# Patient Record
Sex: Male | Born: 1995 | Race: Black or African American | Hispanic: No | Marital: Single | Smoking: Never smoker
Health system: Southern US, Community
[De-identification: ages and names within clinical notes are randomized; demographics above are authoritative.]

---

## 2015-09-08 ENCOUNTER — Emergency Department (HOSPITAL_COMMUNITY)
Admission: EM | Admit: 2015-09-08 | Discharge: 2015-09-09 | Disposition: A | Discharge status: Home / Self Care | Payer: Self-pay | Attending: Emergency Medicine | Admitting: Emergency Medicine

## 2015-09-08 ENCOUNTER — Emergency Department (HOSPITAL_COMMUNITY): Payer: Self-pay

## 2015-09-08 ENCOUNTER — Encounter (HOSPITAL_COMMUNITY): Payer: Self-pay | Admitting: Emergency Medicine

## 2015-09-08 DIAGNOSIS — Y9389 Activity, other specified: Secondary | ICD-10-CM | POA: Insufficient documentation

## 2015-09-08 DIAGNOSIS — W002XXA Other fall from one level to another due to ice and snow, initial encounter: Secondary | ICD-10-CM | POA: Insufficient documentation

## 2015-09-08 DIAGNOSIS — Y9289 Other specified places as the place of occurrence of the external cause: Secondary | ICD-10-CM | POA: Insufficient documentation

## 2015-09-08 DIAGNOSIS — S83004A Unspecified dislocation of right patella, initial encounter: Secondary | ICD-10-CM | POA: Insufficient documentation

## 2015-09-08 DIAGNOSIS — M25461 Effusion, right knee: Secondary | ICD-10-CM | POA: Insufficient documentation

## 2015-09-08 DIAGNOSIS — Y998 Other external cause status: Secondary | ICD-10-CM | POA: Insufficient documentation

## 2015-09-08 NOTE — ED Notes (Signed)
Patient reports falling in snow and being kicked in right knee. C/o pain and swelling. Mild swelling to same. Denies numbness or tingling. Ambulatory with steady gait.

## 2015-09-08 NOTE — Discharge Instructions (Signed)
Keep the knee immobilizer on at all times including when sleeping except when bathing. Do not stop using the knee immobilizer until you are cleared by your orthopedist.  Do not hesitate to return to the emergency room for any new, worsening or concerning symptoms.  Please obtain primary care using resource guide below. Let them know that you were seen in the emergency room and that they will need to obtain records for further outpatient management.   Patellar Dislocation A patellar dislocation occurs when your kneecap (patella) slips out of its normal position in a groove in front of the lower end of your thighbone (femur). This groove is called the patellofemoral groove.  CAUSES The kneecap is normally positioned over the front of the knee joint at the base of the thighbone. A kneecap can be dislocated when:  The kneecap is out of place (patellar tracking disorder), and force is applied.  The foot is firmly planted pointing outward, and the knee bends with the thigh turned inward. This kind of injury is common during many sports activities.  The inner edge of the kneecap is hit, pushing it toward the outer side of the leg. SIGNS AND SYMPTOMS  Severe pain.  A misshapen knee that looks like a bone is out of position.  A popping sensation, followed by a feeling that something is out of place.  Inability to bend or straighten the knee.  Knee swelling.  Cool, pale skin or numbness and tingling in or below the affected knee. DIAGNOSIS  Your health care provider will physically examine the injured area. An X-ray exam may be done to make sure a bone fracture has not occurred. In some cases, your health care provider may look inside your knee joint with an instrument much like a pencil-sized telescope (arthroscope). This may be done to make sure you have no loose cartilage in your joint. Loose cartilage is not visible on an X-ray image. TREATMENT  In many instances, the patella can be guided  back into position without much difficulty. It often goes back into position by straightening the leg. Often, nothing more may be needed other than a brief period of immobilization followed by the exercises your health care provider recommends. If patellar dislocation starts to become frequent after the first incident, surgery may be needed to prevent your patella from slipping out of place. HOME CARE INSTRUCTIONS   Only take over-the-counter or prescription medicines for pain, discomfort, or fever as directed by your health care provider.  Use a knee brace if directed to do so by your health care provider.  Use crutches as instructed.  Apply ice to the injured knee:  Put ice in a plastic bag.  Place a towel between your skin and the bag.  Leave the ice on for 20 minutes, 2-3 times a day.  Follow your health care provider's instructions for doing any recommended range-of-motion exercises or other exercises. SEEK IMMEDIATE MEDICAL CARE IF:  You have increased pain or swelling in the knee that is not relieved with medicine.  You have increasing inflammation in the knee.  You have locking or catching of your knee. MAKE SURE YOU:  Understand these instructions.  Will watch your condition.  Will get help right away if you are not doing well or get worse.   This information is not intended to replace advice given to you by your health care provider. Make sure you discuss any questions you have with your health care provider.   Document Released: 05/07/2001  Document Revised: 06/02/2013 Document Reviewed: 03/24/2013 Elsevier Interactive Patient Education 2016 [employer]  How to Use a Knee Brace A knee brace is a device that you wear to support your knee, especially if the knee is healing after an injury or surgery. There are several types of knee braces. Some are designed to prevent an injury (prophylactic brace). These are often worn during sports. Others support an injured knee  (functional brace) or keep it still while it heals (rehabilitative brace). People with severe arthritis of the knee may benefit from a brace that takes some pressure off the knee (unloader brace). Most knee braces are made from a combination of cloth and metal or plastic.  You may need to wear a knee brace to:  Relieve knee pain.  Help your knee support your weight (improve stability).  Help you walk farther (improve mobility).  Prevent injury.  Support your knee while it heals from surgery or from an injury. RISKS AND COMPLICATIONS Generally, knee braces are very safe to wear. However, problems may occur, including:  Skin irritation that may lead to infection.  Making your condition worse if you wear the brace in the wrong way. HOW TO USE A KNEE BRACE Different braces will have different instructions for use. Your health care provider will tell you or show you:  How to put on your brace.  How to adjust the brace.  When and how often to wear the brace.  How to remove the brace.  If you will need any assistive devices in addition to the brace, such as crutches or a cane. In general, your brace should:  Have the hinge of the brace line up with the bend of your knee.  Have straps, hooks, or tapes that fasten snugly around your leg.  Not feel too tight or too loose. HOW TO CARE FOR A KNEE BRACE  Check your brace often for signs of damage, such as loose connections or attachments. Your knee brace may get damaged or wear out during normal use.  Wash the fabric parts of your brace with soap and water.  Read the insert that comes with your brace for other specific care instructions. SEEK MEDICAL CARE IF:  Your knee brace is too loose or too tight and you cannot adjust it.  Your knee brace causes skin redness, swelling, bruising, or irritation.  Your knee brace is not helping.  Your knee brace is making your knee pain worse.   This information is not intended to replace  advice given to you by your health care provider. Make sure you discuss any questions you have with your health care provider.   Document Released: 11/02/2003 Document Revised: 05/03/2015 Document Reviewed: 12/05/2014 Elsevier Interactive Patient Education 2016 [employer]   Emergency Department Resource Guide 1) Find a Doctor and Pay Out of Pocket Although you won't have to find out who is covered by your insurance plan, it is a good idea to ask around and get recommendations. You will then need to call the office and see if the doctor you have chosen will accept you as a new patient and what types of options they offer for patients who are self-pay. Some doctors offer discounts or will set up payment plans for their patients who do not have insurance, but you will need to ask so you aren't surprised when you get to your appointment.  2) Contact Your Local Health Department Not all health departments have doctors that can see patients for sick visits, but  many do, so it is worth a call to see if yours does. If you don't know where your local health department is, you can check in your phone book. The CDC also has a tool to help you locate your state's health department, and many state websites also have listings of all of their local health departments.  3) Find a Walk-in Clinic If your illness is not likely to be very severe or complicated, you may want to try a walk in clinic. These are popping up all over the country in pharmacies, drugstores, and shopping centers. They're usually staffed by nurse practitioners or physician assistants that have been trained to treat common illnesses and complaints. They're usually fairly quick and inexpensive. However, if you have serious medical issues or chronic medical problems, these are probably not your best option.  No Primary Care Doctor: - Call Health Connect at  [phone] - they can help you locate a primary care doctor that  accepts your insurance,  provides certain services, etc. - Physician Referral Service- [phone]  Chronic Pain Problems: Organization         Address  Phone   Notes  [facility]  [phone] Patients need to be referred by their primary care doctor.   Medication Assistance: Organization         Address  Phone   Notes  [facility] [address] [place], [place] [address] [phone] --Must be a resident of [facility] -- Must have NO insurance coverage whatsoever (no Medicaid/ Medicare, etc.) -- The pt. MUST have a primary care doctor that directs their care regularly and follows them in the community   [facility]  [phone]   [employer]  [phone]    Agencies that provide inexpensive medical care: Organization         Address  Phone   Notes  [facility]  [phone]   [facility]    [phone]   [facility] [address] [place], [place] [address] [phone]   [facility] [address] [place], [place] [phone]   [facility]anned Parenthood    [phone]   [facility]    [phone]   [facility]  [address], [place] Phone:  [phone], Fax:  [phone] Hours of Operation:  9 am - 6 pm, M-F.  Also accepts Medicaid/Medicare and self-pay.  [facility]  [address], [place] Phone: [phone], Fax: [phone]. Hours of Operation:  8:30 am - 5:30 pm, M-F.  Also accepts Medicaid and self-pay.  [facility] [place] [address] [place] Phone: [phone]   [facility] [address] Natasha Bence [place], [place] [phone], Ext. [phone] Mondays & Thursdays: 7-9 AM.  First 15 patients are seen on a first come, first serve basis.    Medicaid-accepting [facility] Providers:  Organization         Address  Phone    Notes  [facility] [address], [place] [phone] Also accepts self-pay patients.  [facility] [address] Laurell Josephs [place], [place]  [phone]   [facility] [address], [place] [phone]   [facility] [address], [place] [phone]   Adrian Saran  [place] [address], [place]   [phone] Only accepts [place] patients after they have their name applied to their card.   Self-Pay (no insurance) in [facility]:  Organization         Address  Phone   Notes  Sickle Cell Patients, [facility] [address] [place], [place] [phone]   [facility] [address] [place], [place] [phone]   [facility] [place]  [address], [place] [phone]   [facility]/Dr. Osei-Bonsu  [address] [place] or [address], [place] [phone] Phone number for both [place] and [place] locations is the same.  [facility] [address] [place] [phone]   [facility] [address], [place] or [address] [phone]   [facility] [address], [place] [phone], phone; [phone], fax Sees patients 1st and 3rd Saturday of every month.  Must not qualify for public or private insurance (i.e. Medicaid, Medicare, [place] Health Choice, Veterans' Benefits)  Household income should be no more than 200% of the poverty level The clinic cannot treat you if you are pregnant or think you are pregnant  Sexually transmitted diseases are not treated at the clinic.    Dental Care: Organization         Address  Phone  Notes  [facility] Department of [facility] [address] [place], [place] [phone] Accepts children up to age 41 who are enrolled in [place] or [place] Health Choice; pregnant women with a Medicaid card; and children who have applied for Medicaid or [place] Health Choice, but were declined, whose parents can pay a reduced fee at time of service.  [facility] Department of [facility]  [address], [place] [phone] Accepts children up to age 28 who are enrolled in [place] or [place] Health Choice; pregnant women with a Medicaid card; and children who have applied for Medicaid or [place] Health Choice, but were declined, whose parents can pay a reduced fee at time of service.  [facility]  [address] [place], [place] [phone] Patients are seen by appointment only. Walk-ins are not accepted. [facility] will see patients 54 years of age and older. Monday - Tuesday (8am-5pm) Most Wednesdays (8:30-5pm) $30 per visit, cash only  [facility]  [address], [facility] [phone] Patients are seen by appointment only. Walk-ins are not accepted. [facility] will see patients 30 years of age and older. One Wednesday Evening (Monthly: Volunteer Based).  $30 per visit, cash only  [employer]  [phone] for adults; Children under age 52, call Graduate Pediatric Dentistry at [phone]. Children aged 18-14, please call [phone] to request a pediatric application.  Dental services are provided in all areas of dental care including fillings, crowns and bridges, complete and partial dentures, implants, gum treatment, root canals, and extractions. Preventive care is also provided. Treatment is provided to both adults and children. Patients are selected via a lottery and there is often a waiting list.   [facility] [address] [place]  [phone] [email or web address]   [facility] [address] [place], [place] [phone], Ext.  [phone]  Second and Fourth Thursday of each month, opens at 6:30 AM; Clinic ends at 9 AM.  Patients are seen on a first-come first-served basis, and a limited number are seen during each clinic.   [facility]  [address] Ether Griffins [place], [place] [phone]   Eligibility Requirements You must have lived in [place], [place], or [place] counties for at least the last three months.   You cannot be eligible for state or federal sponsored [place], including [employer], [place], or [employer].   You generally cannot be eligible for healthcare insurance through your employer.    How to apply: Eligibility screenings are held every Tuesday and Wednesday afternoon from 1:00 pm until 4:00 pm. You do not need an appointment for the interview!  [facility] [address], [place], [place] [phone]   [facility] Health Department  [phone]   [facility] Health Department  [phone]   [facility] Department  [phone]    Behavioral Health Resources in the Community: Intensive Outpatient Programs Organization         Address  Phone  Notes  [facility] [address], [place], [place] [phone]   [facility] [address], [place], [place] [phone]   [facility] [address], [place], [place]  [phone]   [facility] [address]  [place], [place] [phone] or [phone]   Substance Abuse Resources Organization         Address  Phone  Notes  [facility]  [phone]   [facility]  [phone]   [facility] [place]  [phone]   Floydene Flock  [phone]   Residential & Outpatient Substance Abuse Program  [phone]   Psychological Services Organization         Address  Phone  Notes  [facility]  [phone]   [facility] Services  [phone]   [facility]  Mental Health [address] [place] [phone] or [phone]    Mobile Crisis Teams Organization         Address  Phone  Notes  [facility], Mobile Crisis Care Unit  [phone]   [facility]  [address] [place], [place] [phone]   Doristine Locks [address] [place] [phone]    Self-Help/Support Groups Organization         Address  Phone             Notes  [facility] [place] - variety of support groups  336- I7437963 Call for more information  Narcotics Anonymous (NA), [facility] [address], [employer] [place]  2 meetings at this location   Statistician         Address  Phone  Notes  [facility] [address],    [place]  [phone]   [facility]  [address] [place] [address], [place], [place] [phone]   [facility] [address] [place], [place] [phone] Admissions: 8am-3pm M-F  [facility] [address],    [place], [place] [phone]   [facility] [address] Starling Manns [place], [place] [phone]   [facility] [address],  [place], [place] [phone]   [facility] - Intensive Outpatient [address] Laurell Josephs [address], [place], [place] [phone]   [facility] ([facility] [phone]  [employer]  [place], [place] [phone] or [phone]   [facility] ([facility]) [address], [place], [place] [phone] Accepts Medicaid  [facility] [address]  [place] [phone] Substance Abuse/Addiction Treatment   [facility] Organization         Address  Phone  Notes  [facility]  [phone]   Angie Fava, PhD [address] Ervin Knack [place], [place]   [phone] or [phone]   [facility] Behavioral   [address] [place], [place] [phone]   [facility] [address], [place], [place] [phone] Insurance/Medicaid/sponsorship through [facility] and Families [address]                                    [place], [place] [phone] Therapy/tele-psych/case  [facility] [address], [place] [phone]    Dr. Lolly Mustache  [phone]   [facility] [place] [place]  [employer] [facility] Dept. 1) [address], [place] 2) [address] [place] 3)  [address], [place] [phone]  [phone]   [facility] Child Abuse Hotline 6184253897 or [phone] (After Hours)

## 2015-09-08 NOTE — ED Provider Notes (Signed)
CSN: [id number]     Arrival date & time 09/08/15  1958 History  By signing my name below, I, Budd Palmer, attest that this documentation has been prepared under the direction and in the presence of [employer], PA-C. Electronically Signed: Budd Palmer, ED Scribe. 09/08/2015. 11:18 PM.    Chief Complaint  Patient presents with  . Fall  . Knee Pain   The history is provided by the patient. No language interpreter was used.   HPI Comments: Mathew Banks is a 20 y.o. male who presents to the Emergency Department complaining of constant, aching right knee pain onset after a fall that occurred 6 days ago. Pt states his friend fell in the snow and accidentally kicked him in the left knee, causing him to fall down a hill. He notes that the knee was dislocated, but that he was able to "pop it back into place." He states that the pain has since alleviated, but the swelling has not reduced since onset. He reports exacerbation of the pain with weight bearing and palpation. He notes he is able to ambulate. Pt denies any other pains/trauma in the fall.   History reviewed. No pertinent past medical history. History reviewed. No pertinent past surgical history. No family history on file. Social History  Substance Use Topics  . Smoking status: Never Smoker   . Smokeless tobacco: None  . Alcohol Use: No    Review of Systems A complete 10 system review of systems was obtained and all systems are negative except as noted in the HPI and PMH.   Allergies  Review of patient's allergies indicates no known allergies.  Home Medications   Prior to Admission medications   Medication Sig Start Date End Date Taking? Authorizing Provider  acetaminophen (TYLENOL) 500 MG tablet Take 1,000 mg by mouth every 6 (six) hours as needed for headache.   Yes Historical Provider, MD   BP 151/88 mmHg  Pulse 108  Temp(Src) 98.2 F (36.8 C) (Oral)  Resp 20  SpO2 100% Physical Exam  Constitutional: He is  oriented to person, place, and time. He appears well-developed and well-nourished. No distress.  HENT:  Head: Normocephalic and atraumatic.  Eyes: Conjunctivae and EOM are normal. Right eye exhibits no discharge. Left eye exhibits no discharge.  Cardiovascular: Normal rate.   Pulmonary/Chest: Effort normal. No stridor. No respiratory distress.  Musculoskeletal: Normal range of motion. He exhibits edema and tenderness.  Right knee with effusion, excellent range of motion, kneecap ballotable. Grossly stable to anterior and posterior drawer, no abnormal laxity on valgus or varus stress. Patient ambulatory with an antalgic gait distally neurovascularly intact.  Neurological: He is alert and oriented to person, place, and time. Coordination normal.  Skin: Skin is warm and dry. No rash noted. He is not diaphoretic. No erythema.  Psychiatric: He has a normal mood and affect.  Nursing note and vitals reviewed.   ED Course  Procedures  DIAGNOSTIC STUDIES: Oxygen Saturation is 100% on RA, normal by my interpretation.    COORDINATION OF CARE: 11:14 PM - Discussed plans to order a knee immobilizer. Advised pt to apply ice packs and f/u with an orthopedist. Pt advised of plan for treatment and pt agrees.  Labs Review Labs Reviewed - No data to display  Imaging Review Dg Knee Complete 4 Views Right  09/08/2015  CLINICAL DATA:  Pain following fall ; patient kicked in right knee EXAM: RIGHT KNEE - COMPLETE 4+ VIEW COMPARISON:  None. FINDINGS: Frontal, bilateral oblique, and lateral  views obtained. There is no fracture or frank dislocation. There is, however, lateral patellar subluxation. There is a sizable joint effusion. The joint spaces appear intact. No erosive change. IMPRESSION: Lateral patellar subluxation with sizable joint effusion. No fracture. No appreciable arthropathic change. Electronically Signed   By: Bretta BangWilliam  Woodruff III M.D.   On: 09/08/2015 21:18   I have personally reviewed and  evaluated these images and lab results as part of my medical decision-making.   EKG Interpretation None      MDM   Final diagnoses:  Dislocated patella, right, initial encounter    Filed Vitals:   09/08/15 2040  BP: 151/88  Pulse: 108  Temp: 98.2 F (36.8 C)  TempSrc: Oral  Resp: 20  SpO2: 100%    Mathew Banks is 20 y.o. male presenting with right knee pain, states that he dislocated his patella after he was kicked while horse playing on ice. He is neurovascularly intact, knee is grossly stable. X-ray consistent with a subluxation of the patella. Patient is placed in knee immobilizer, given crutches and recommended close follow-up with orthopedist.  Evaluation does not show pathology that would require ongoing emergent intervention or inpatient treatment. Pt is hemodynamically stable and mentating appropriately. Discussed findings and plan with patient/guardian, who agrees with care plan. All questions answered. Return precautions discussed and outpatient follow up given.   I personally performed the services described in this documentation, which was scribed in my presence. The recorded information has been reviewed and is accurate.   Wynetta Emeryicole Mardi Cannady, PA-C 09/09/15 0006  Nelva Nayobert Beaton, MD 09/13/15 [phone]

## 2017-06-30 IMAGING — CR DG KNEE COMPLETE 4+V*R*
4 series · 4 of 4 positions shown · non-contrast
Comparison: None.

CLINICAL DATA: Pain following fall ; patient kicked in right knee

EXAM:
RIGHT KNEE - COMPLETE 4+ VIEW

[t knee ap right]
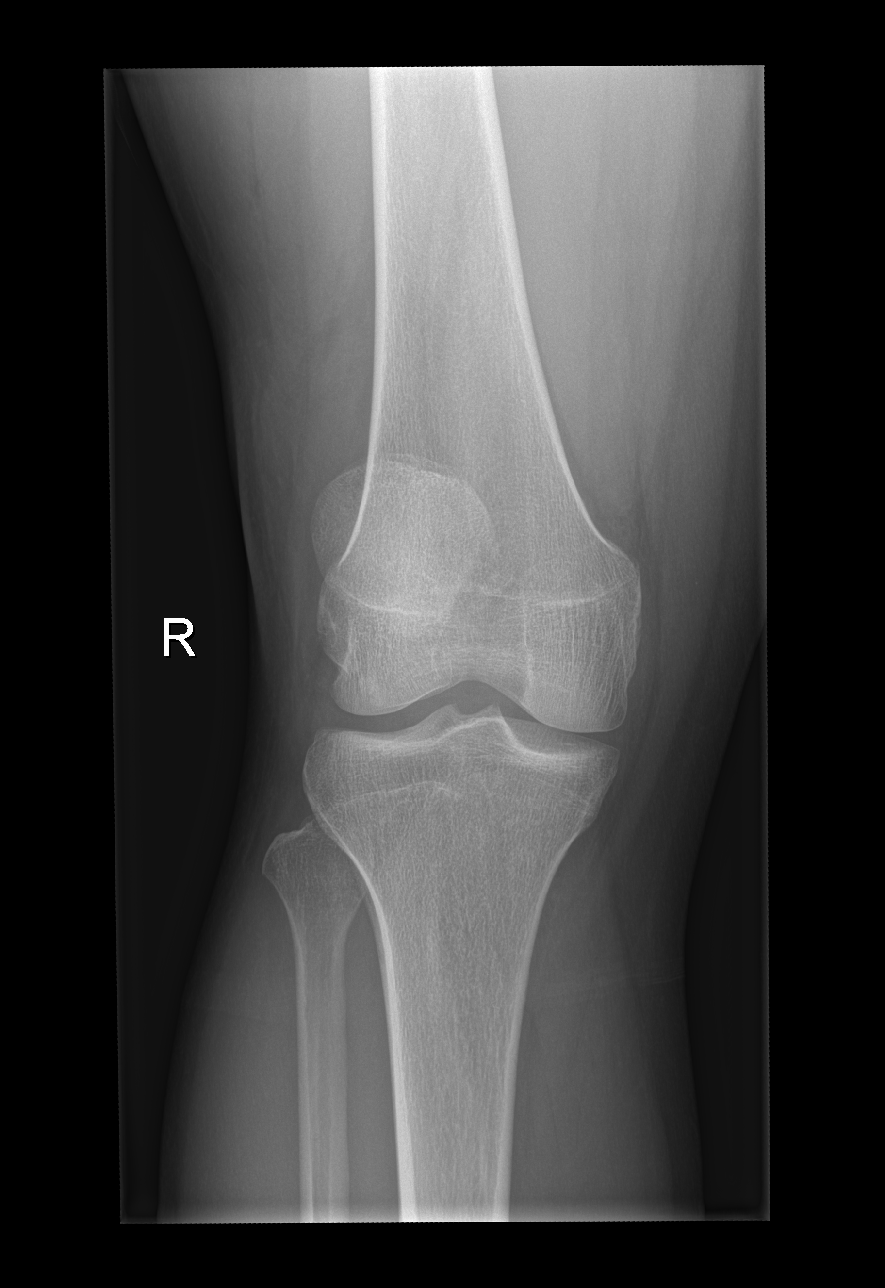

[t knee obl right (1 of 2)]
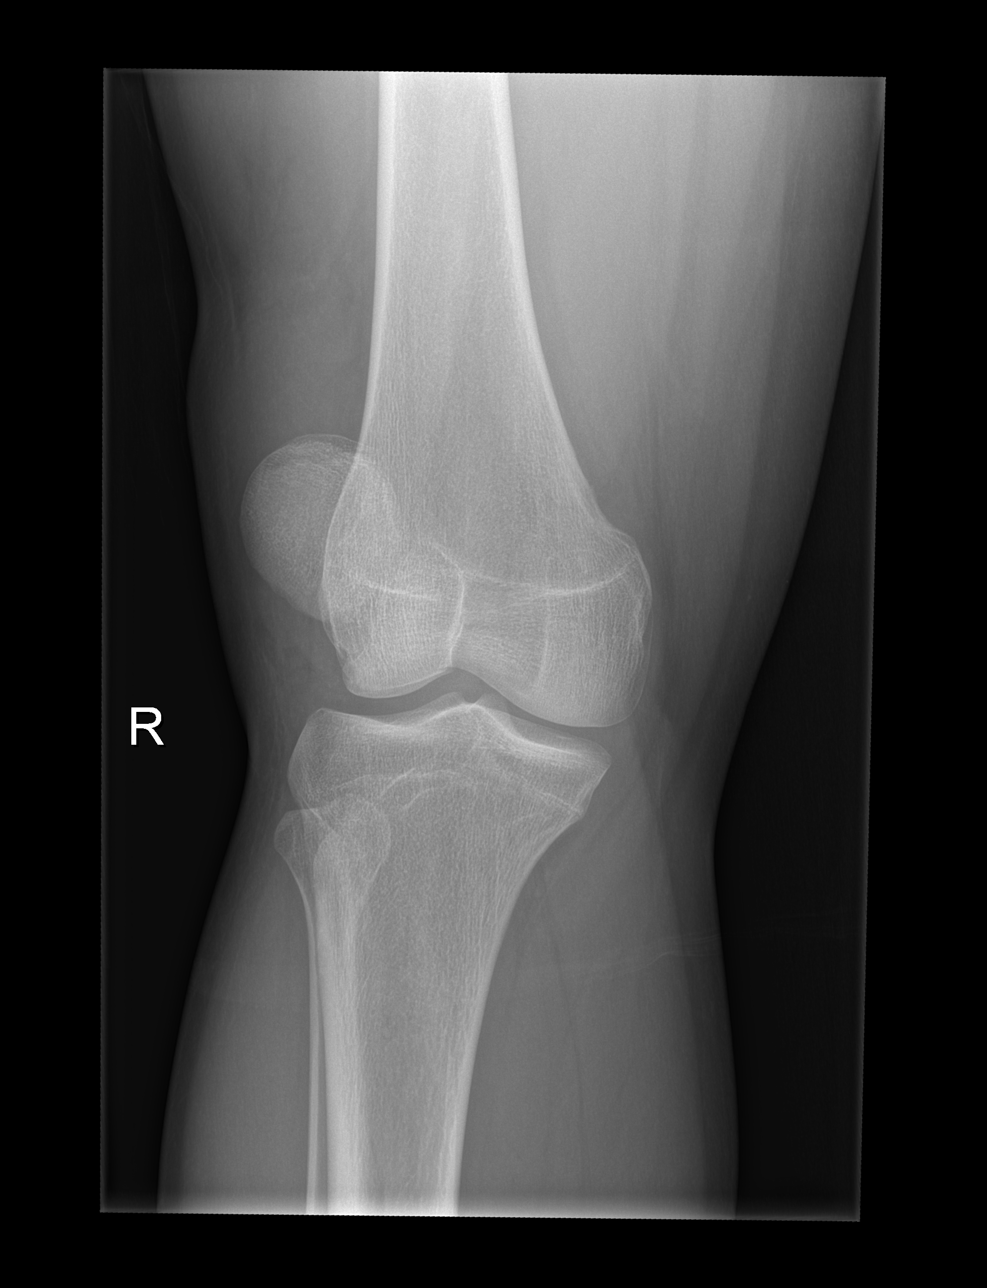

[t knee obl right (2 of 2)]
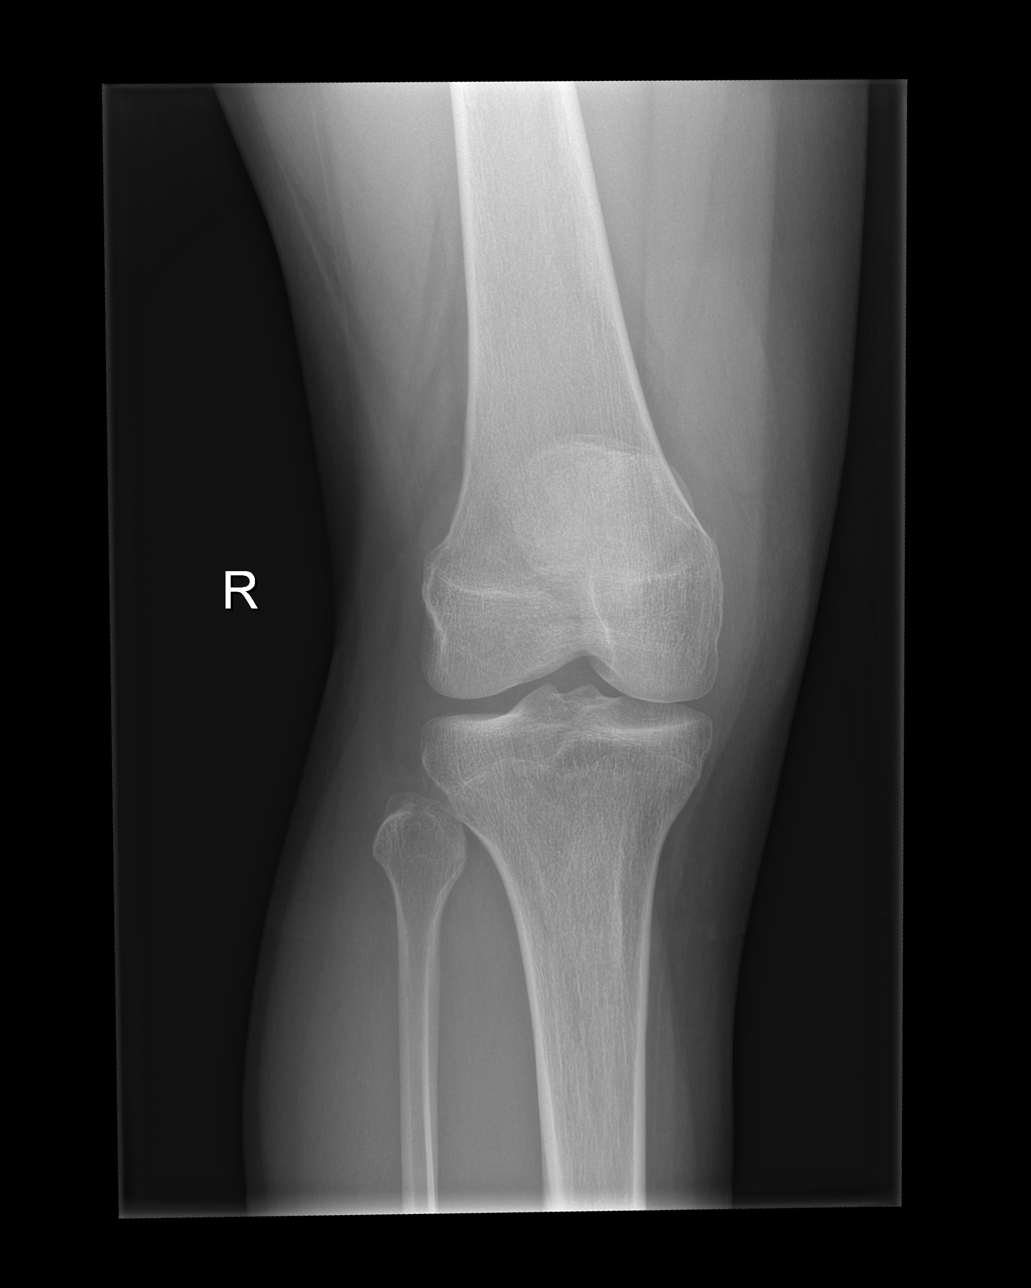

[t knee lat right]
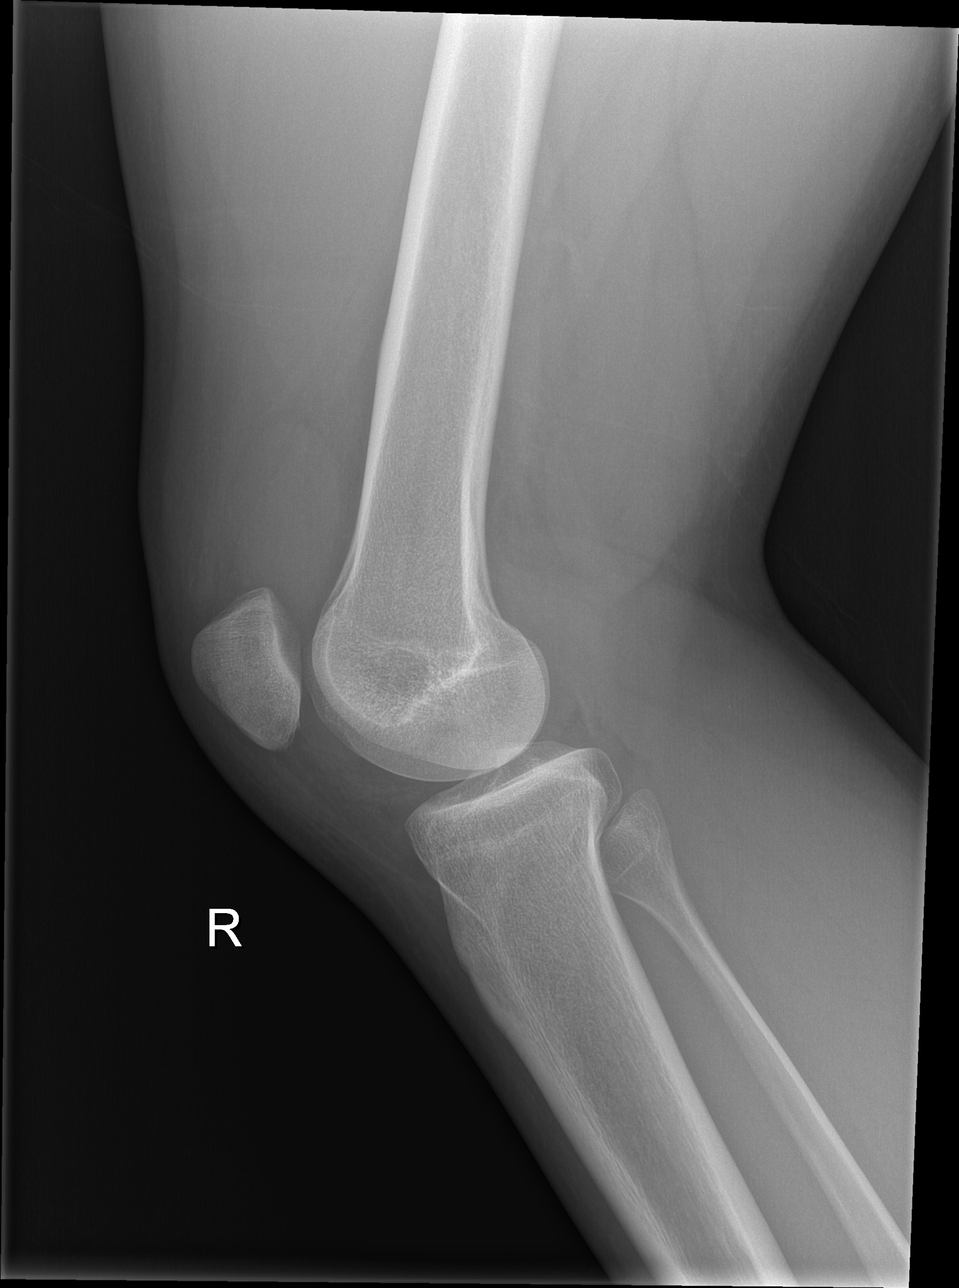

[4 of 4 positions shown; findings below may reference images not displayed]

FINDINGS: Frontal, bilateral oblique, and lateral views obtained. There is no
fracture or frank dislocation. There is, however, lateral patellar
subluxation. There is a sizable joint effusion. The joint spaces
appear intact. No erosive change.
IMPRESSION: Lateral patellar subluxation with sizable joint effusion. No
fracture. No appreciable arthropathic change.
